# Patient Record
Sex: Male | Born: 1965 | Race: White | Hispanic: No | Marital: Single | Smoking: Current every day smoker
Health system: Southern US, Community
[De-identification: ages and names within clinical notes are randomized; demographics above are authoritative.]

---

## 2017-11-23 ENCOUNTER — Other Ambulatory Visit: Payer: Self-pay | Admitting: Pain Medicine

## 2017-11-23 ENCOUNTER — Encounter: Payer: Self-pay | Admitting: Internal Medicine

## 2017-11-23 ENCOUNTER — Ambulatory Visit (INDEPENDENT_AMBULATORY_CARE_PROVIDER_SITE_OTHER): Payer: 59 | Admitting: Internal Medicine

## 2017-11-23 ENCOUNTER — Ambulatory Visit
Admission: RE | Admit: 2017-11-23 | Discharge: 2017-11-23 | Disposition: A | Discharge status: Home / Self Care | Payer: 59 | Source: Ambulatory Visit | Attending: Pain Medicine | Admitting: Pain Medicine

## 2017-11-23 VITALS — BP 116/70 | HR 81 | Ht 73.0 in | Wt 208.0 lb

## 2017-11-23 DIAGNOSIS — G4733 Obstructive sleep apnea (adult) (pediatric): Secondary | ICD-10-CM

## 2017-11-23 DIAGNOSIS — Z720 Tobacco use: Secondary | ICD-10-CM | POA: Diagnosis not present

## 2017-11-23 DIAGNOSIS — J302 Other seasonal allergic rhinitis: Secondary | ICD-10-CM | POA: Diagnosis not present

## 2017-11-23 DIAGNOSIS — M542 Cervicalgia: Secondary | ICD-10-CM

## 2017-11-23 DIAGNOSIS — J3089 Other allergic rhinitis: Secondary | ICD-10-CM | POA: Diagnosis not present

## 2017-11-23 NOTE — Assessment & Plan Note (Signed)
High probability based on history and exam.  I think a home sleep test will be appropriate for him.  Note that he was diagnosed with OSA many years ago in [place] but did not get established well on CPAP 10.  Appropriate discussion done and questions answered.  His responsibility to drive safely.  Plan-we will sleep study.  He is to call me for results anticipating we will probably be able to start CPAP.

## 2017-11-23 NOTE — Patient Instructions (Signed)
Order- schedule unattended home sleep test      Dx OSA  Please call me about 2 weeks after your sleep study for results and recommendations. We may be able to start treatment, if appropriate, before we see you back.

## 2017-11-23 NOTE — Assessment & Plan Note (Signed)
Intervals of nasal congestion are likely to complicate CPAP use. Plan-anticipate starting with Flonase and antihistamines as needed.  We will deal with this if it becomes an issue.

## 2017-11-23 NOTE — Progress Notes (Signed)
11/23/17-52 year old male smoker for sleep evaluation referred courtesy of Kathee DeltonKris Kaplan, PA-C. Medical problem list from Care Everywhere-chronic low back pain, hyperlipidemia Patient has been ----diagnosed with OSA at least 15 years ago. Tried a CPAP machine but did not like it. Wants to see about getting another SS done due to snoring and feeling tired during the day.  Girlfriend complains of his loud snoring and has witnessed apneas.  He admits daytime sleepiness which sometimes gets in his way.  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  No night call responsibility.  Original sleep study was in [place] where he says he got little explanation and not enough help adjusting CPAP for comfort. No ENT surgery.  Not aware of heart or lung disease.  Denies parasomnias.  Some seasonal allergic rhinitis.           Prior to Admission medications   Medication Sig Start Date End Date Taking? Authorizing Provider  HYDROcodone-acetaminophen [facility](NORCO) 10-325 MG tablet  11/14/17  Yes [provider]  meloxicam (MOBIC) 15 MG tablet  09/27/17  Yes [provider]  pantoprazole (PROTONIX) 40 MG tablet  10/27/17  Yes [provider]   History reviewed. No pertinent past medical history. History reviewed. No pertinent surgical history. History reviewed. No pertinent family history. Social History   Socioeconomic History  . Marital status: Single    Spouse name: Not on file  . Number of children: Not on file  . Years of education: Not on file  . Highest education level: Not on file  Social Needs  . Financial resource strain: Not on file  . Food insecurity - worry: Not on file  . Food insecurity - inability: Not on file  . Transportation needs - medical: Not on file  . Transportation needs - non-medical: Not on file  Occupational History  . Not on file  Tobacco Use  . Smoking status: Current Every Day Smoker  . Smokeless tobacco: Never Used  Substance and Sexual Activity   . Alcohol use: Not on file  . Drug use: Not on file  . Sexual activity: Not on file  Other Topics Concern  . Not on file  Social History Narrative  . Not on file   ROS-see HPI    + sign = positive Constitutional:    weight loss, night sweats, fevers, chills, +fatigue, lassitude. HEENT:    headaches, difficulty swallowing, tooth/dental problems, sore throat,       sneezing, itching, ear ache, +nasal congestion, post nasal drip, snoring CV:    chest pain, orthopnea, PND, swelling in lower extremities, anasarca,                                  dizziness, palpitations Resp:   shortness of breath with exertion or at rest.                productive cough,  + non-productive cough, coughing up of blood.              change in color of mucus.  wheezing.   Skin:    rash or lesions. GI:  No-   heartburn, indigestion, abdominal pain, nausea, vomiting, diarrhea,                 change in bowel habits, loss of appetite GU: dysuria, change in color of urine, no urgency or frequency.   flank pain. MS:   joint pain, stiffness,  decreased range of motion, back pain. Neuro-     nothing unusual Psych:  change in mood or affect.  depression or +anxiety.   memory loss.  OBJ- Physical Exam General- Alert, Oriented, Affect-appropriate, Distress- none acute Skin- rash-none, lesions- none, excoriation- none Lymphadenopathy- none Head- atraumatic            Eyes- Gross vision intact, PERRLA, conjunctivae and secretions clear            Ears- Hearing, canals-normal            Nose- Clear, no-Septal dev, mucus, polyps, erosion, perforation             Throat- Mallampati III , mucosa clear , drainage- none, tonsils + present, own teeth Neck- flexible , trachea midline, no stridor , thyroid nl, carotid no bruit Chest - symmetrical excursion , unlabored           Heart/CV- RRR , no murmur , no gallop  , no rub, nl s1 s2                           - JVD- none , edema- none, stasis changes- none, varices- none            Lung- clear to P&A, wheeze- none, cough+light, dullness-none, rub- none           Chest wall-  Abd-  Br/ Gen/ Rectal- Not done, not indicated Extrem- cyanosis- none, clubbing, none, atrophy- none, strength- nl Neuro- grossly intact to observation

## 2017-11-23 NOTE — Assessment & Plan Note (Signed)
He started with a pipe and transitioned to cigarettes.  Emphasis on making a real commitment to smoking cessation.

## 2017-12-29 ENCOUNTER — Other Ambulatory Visit: Payer: Self-pay | Admitting: Pain Medicine

## 2017-12-29 DIAGNOSIS — M545 Low back pain: Secondary | ICD-10-CM

## 2018-01-05 ENCOUNTER — Ambulatory Visit
Admission: RE | Admit: 2018-01-05 | Discharge: 2018-01-05 | Disposition: A | Discharge status: Home / Self Care | Payer: 59 | Source: Ambulatory Visit | Attending: Pain Medicine | Admitting: Pain Medicine

## 2018-01-05 DIAGNOSIS — M545 Low back pain: Secondary | ICD-10-CM

## 2018-01-10 DIAGNOSIS — G4733 Obstructive sleep apnea (adult) (pediatric): Secondary | ICD-10-CM | POA: Diagnosis not present

## 2018-01-12 ENCOUNTER — Other Ambulatory Visit: Payer: Self-pay | Admitting: *Deleted

## 2018-01-12 DIAGNOSIS — G4733 Obstructive sleep apnea (adult) (pediatric): Secondary | ICD-10-CM

## 2018-01-16 DIAGNOSIS — G4733 Obstructive sleep apnea (adult) (pediatric): Secondary | ICD-10-CM | POA: Diagnosis not present

## 2018-02-08 ENCOUNTER — Encounter: Payer: Self-pay | Admitting: *Deleted

## 2018-02-08 NOTE — Progress Notes (Signed)
Since 4th attempt I am doing to send a letter to patient to call office, since there is no voicemail to set up. Will leave open to follow up on

## 2018-02-23 ENCOUNTER — Encounter: Payer: Self-pay | Admitting: Internal Medicine

## 2018-02-23 ENCOUNTER — Ambulatory Visit (INDEPENDENT_AMBULATORY_CARE_PROVIDER_SITE_OTHER): Payer: 59 | Admitting: Internal Medicine

## 2018-02-23 VITALS — BP 114/76 | HR 78 | Ht 73.0 in | Wt 199.0 lb

## 2018-02-23 DIAGNOSIS — G4733 Obstructive sleep apnea (adult) (pediatric): Secondary | ICD-10-CM | POA: Diagnosis not present

## 2018-02-23 DIAGNOSIS — Z720 Tobacco use: Secondary | ICD-10-CM

## 2018-02-23 DIAGNOSIS — J3089 Other allergic rhinitis: Secondary | ICD-10-CM

## 2018-02-23 DIAGNOSIS — J302 Other seasonal allergic rhinitis: Secondary | ICD-10-CM

## 2018-02-23 NOTE — Progress Notes (Signed)
[birth year]23/17-52 year old male smoker for sleep evaluation referred courtesy of Kathee Delton, PA-C. Medical problem list from Care Everywhere-chronic low back pain, hyperlipidemia Patient has been ----diagnosed with OSA at least 15 years ago. Tried a CPAP machine but did not like it. Wants to see about getting another SS done due to snoring and feeling tired during the day.  Girlfriend complains of his loud snoring and has witnessed apneas.  He admits daytime sleepiness which sometimes gets in his way.  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  Original sleep study was in [place] where he says he got little explanation and not enough help adjusting CPAP for comfort. No ENT surgery.  Not aware of heart or lung disease.  Denies parasomnias.  Some seasonal allergic rhinitis.           02/23/18-52 year old male smoker followed for OSA, complicated by tobacco use, chronic low back pain, hyperlipidemia, allergic rhinitis  HST 01/10/18- AHI 9.1/ hr, desaturation to 89%, body weight 208 lbs ----OSA: Pt here to review HST.  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  We discussed driving responsibility, good sleep habits, treatment options for mild OSA.  He wants to try CPAP again.  We will start with auto 5-10.  ROS-see HPI    + sign = positive Constitutional:    weight loss, night sweats, fevers, chills, +fatigue, lassitude. HEENT:    headaches, difficulty swallowing, tooth/dental problems, sore throat,       sneezing, itching, ear ache, +nasal congestion, post nasal drip, snoring CV:    chest pain, orthopnea, PND, swelling in lower extremities, anasarca,                                  dizziness, palpitations Resp:   shortness of breath with exertion or at rest.                productive cough,  + non-productive cough, coughing up of blood.              change in color of mucus.  wheezing.   Skin:    rash or lesions. GI:  No-   heartburn, indigestion,  abdominal pain, nausea, vomiting, diarrhea,                 change in bowel habits, loss of appetite GU: dysuria, change in color of urine, no urgency or frequency.   flank pain. MS:   joint pain, stiffness, decreased range of motion, back pain. Neuro-     nothing unusual Psych:  change in mood or affect.  depression or +anxiety.   memory loss.  OBJ- Physical Exam General- Alert, Oriented, Affect-appropriate, Distress- none acute Skin- rash-none, lesions- none, excoriation- none Lymphadenopathy- none Head- atraumatic            Eyes- Gross vision intact, PERRLA, conjunctivae and secretions clear            Ears- Hearing, canals-normal            Nose- Clear, no-Septal dev, mucus, polyps, erosion, perforation             Throat- Mallampati III , mucosa clear , drainage- none, tonsils + present, own teeth Neck- flexible , trachea midline, no stridor , thyroid nl, carotid no bruit Chest - symmetrical excursion , unlabored           Heart/CV- RRR ,  no murmur , no gallop  , no rub, nl s1 s2                           - JVD- none , edema- none, stasis changes- none, varices- none           Lung- clear to P&A, wheeze- none, cough+light, dullness-none, rub- none           Chest wall-  Abd-  Br/ Gen/ Rectal- Not done, not indicated Extrem- cyanosis- none, clubbing, none, atrophy- none, strength- nl Neuro- grossly intact to observation

## 2018-02-23 NOTE — Patient Instructions (Signed)
Order- new DME, new CPAP auto 5-20, mask of choice, humidifier, supplies, AirView     Dx OSA  Please call as needed   

## 2018-02-24 NOTE — Assessment & Plan Note (Signed)
Appropriate educational discussion and consideration of treatment options. Plan-CPAP auto 5-10

## 2018-02-24 NOTE — Assessment & Plan Note (Signed)
He is not having enough trouble yet to interfere with CPAP use or comfortable sleep.  We discussed Flonase/Claritin if needed.

## 2018-02-24 NOTE — Assessment & Plan Note (Signed)
Is advised to stop smoking.

## 2018-05-31 ENCOUNTER — Ambulatory Visit: Payer: 59 | Admitting: Internal Medicine

## 2019-12-05 IMAGING — CR DG CERVICAL SPINE 2 OR 3 VIEWS
5 series · 5 of 5 positions shown · non-contrast
Comparison: None.

CLINICAL DATA: Chronic right neck pain radiating to right shoulder
for several months. No known injury. Initial encounter.

EXAM:
CERVICAL SPINE - 2-3 VIEW

[w cervical spine lat]
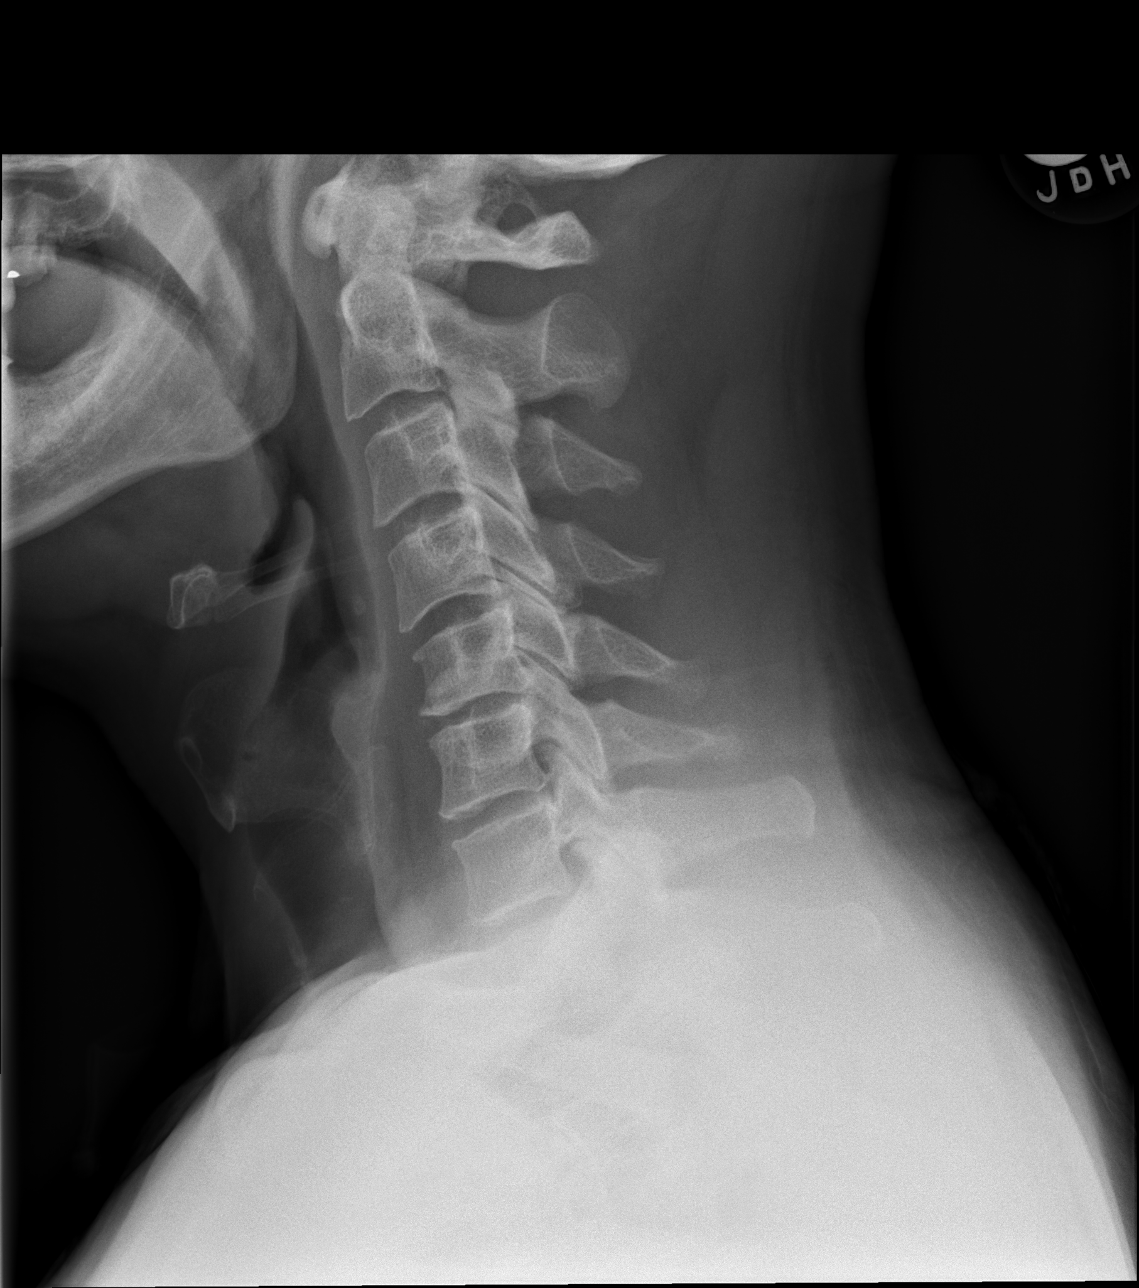

[w cervical swimmers]
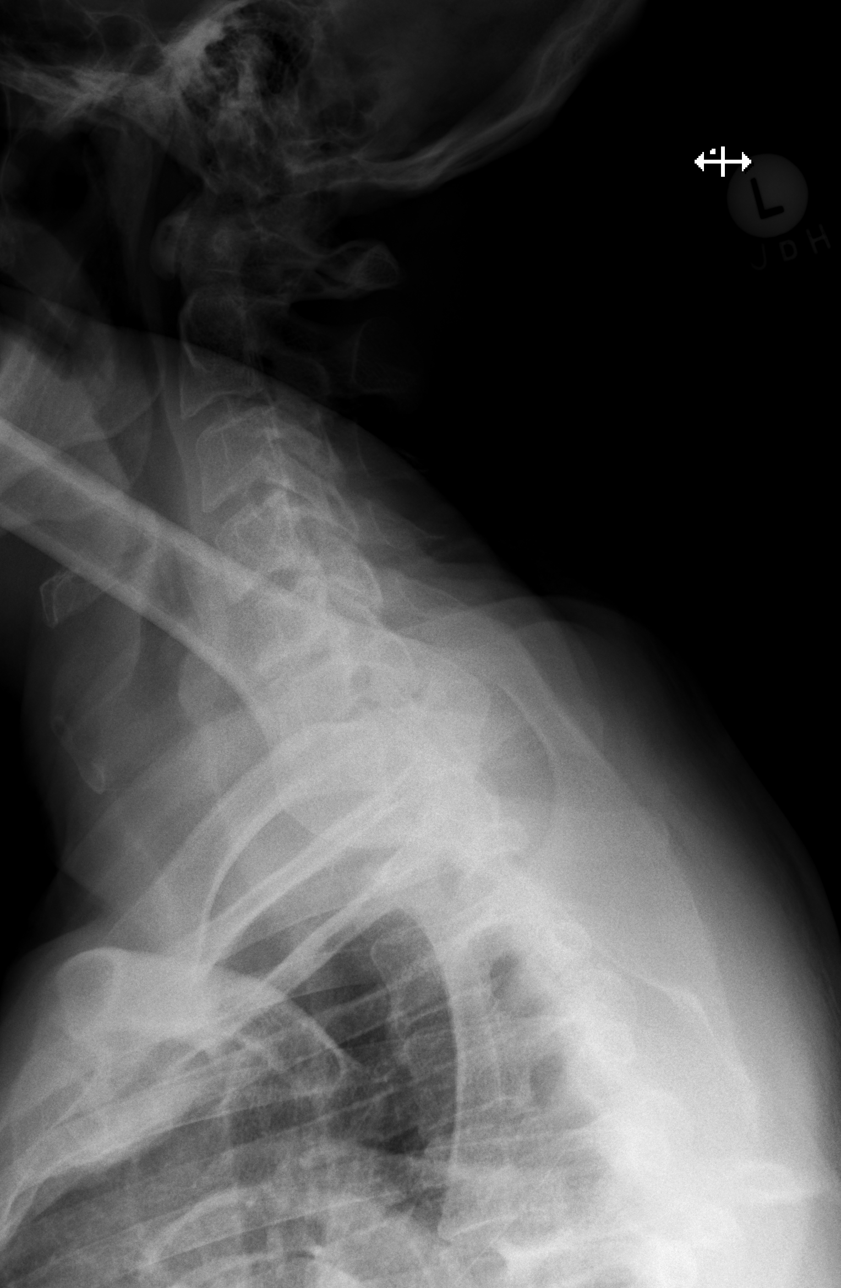

[w cervical spine ap]
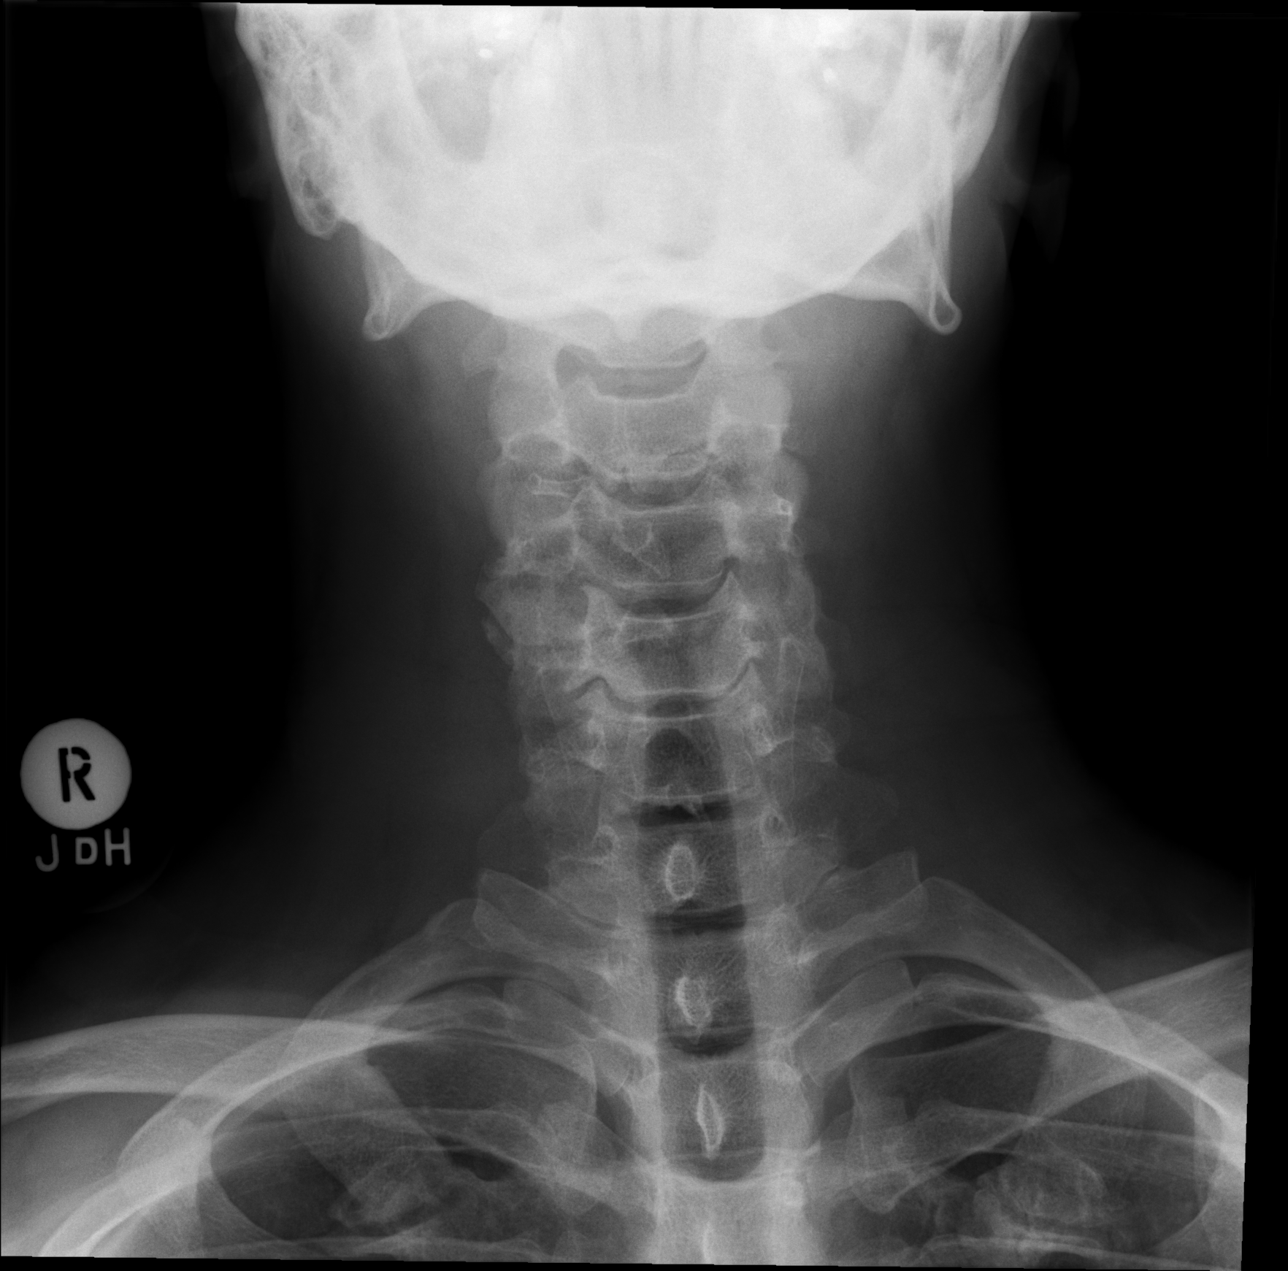

[w cervical spine odontoid (1 of 2)]
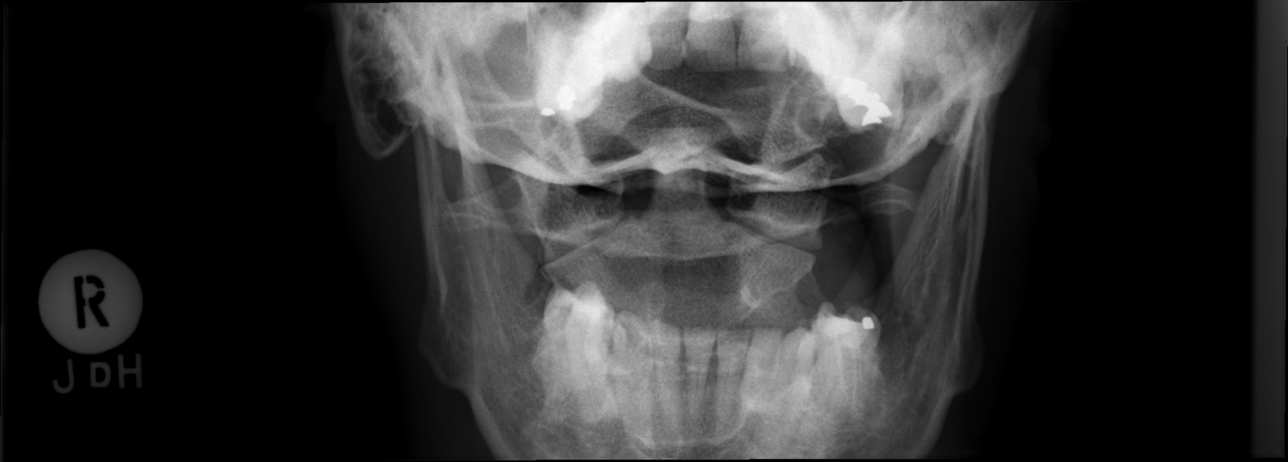

[w cervical spine odontoid (2 of 2)]
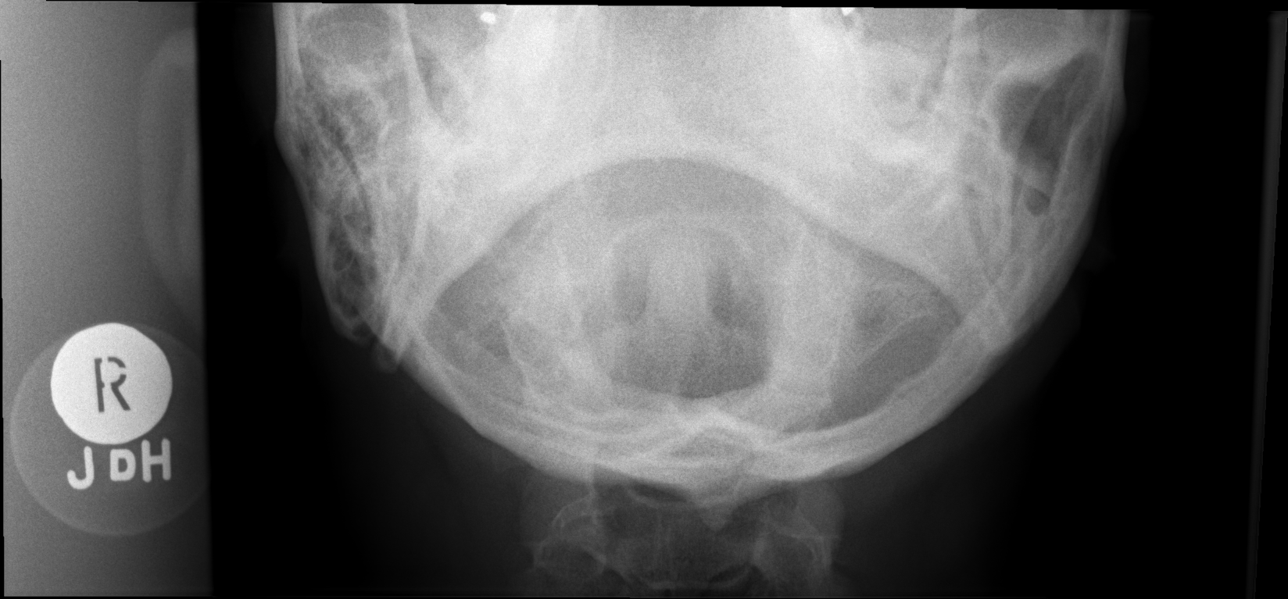

[5 of 5 positions shown; findings below may reference images not displayed]

FINDINGS: Normal alignment noted.

There is no evidence of acute fracture, subluxation or prevertebral
soft tissue swelling.

Mild degenerative disc disease and spondylosis at C5-C6 and C6-C7
noted.

Mild multilevel facet arthropathy identified.

No focal bony lesions are present.
IMPRESSION: Mild degenerative changes within the cervical spine as described.

## 2021-04-30 ENCOUNTER — Encounter (HOSPITAL_BASED_OUTPATIENT_CLINIC_OR_DEPARTMENT_OTHER): Payer: Self-pay | Admitting: *Deleted

## 2021-04-30 ENCOUNTER — Other Ambulatory Visit: Payer: Self-pay

## 2021-04-30 ENCOUNTER — Emergency Department (HOSPITAL_BASED_OUTPATIENT_CLINIC_OR_DEPARTMENT_OTHER)
Admission: EM | Admit: 2021-04-30 | Discharge: 2021-04-30 | Disposition: A | Discharge status: Home / Self Care | Payer: Self-pay | Attending: Emergency Medicine | Admitting: Emergency Medicine

## 2021-04-30 ENCOUNTER — Emergency Department (HOSPITAL_BASED_OUTPATIENT_CLINIC_OR_DEPARTMENT_OTHER): Payer: Self-pay

## 2021-04-30 DIAGNOSIS — S61213A Laceration without foreign body of left middle finger without damage to nail, initial encounter: Secondary | ICD-10-CM | POA: Insufficient documentation

## 2021-04-30 DIAGNOSIS — F17210 Nicotine dependence, cigarettes, uncomplicated: Secondary | ICD-10-CM | POA: Insufficient documentation

## 2021-04-30 DIAGNOSIS — S61012A Laceration without foreign body of left thumb without damage to nail, initial encounter: Secondary | ICD-10-CM | POA: Insufficient documentation

## 2021-04-30 DIAGNOSIS — W293XXA Contact with powered garden and outdoor hand tools and machinery, initial encounter: Secondary | ICD-10-CM | POA: Insufficient documentation

## 2021-04-30 DIAGNOSIS — S61215A Laceration without foreign body of left ring finger without damage to nail, initial encounter: Secondary | ICD-10-CM | POA: Insufficient documentation

## 2021-04-30 MED ORDER — HYDROCODONE-ACETAMINOPHEN 5-325 MG PO TABS: 1.0000 | ORAL_TABLET | ORAL | 0 refills | Status: AC | PRN

## 2021-04-30 MED ORDER — HYDROMORPHONE HCL 1 MG/ML IJ SOLN
INTRAMUSCULAR | Status: AC
  Filled 2021-04-30: qty 1

## 2021-04-30 MED ORDER — BUPIVACAINE HCL 0.5 % IJ SOLN
50.0000 mL | Freq: Once | INTRAMUSCULAR | Status: AC
  Administered 2021-04-30: 50 mL
  Filled 2021-04-30: qty 1

## 2021-04-30 MED ORDER — SULFAMETHOXAZOLE-TRIMETHOPRIM 800-160 MG PO TABS: 1.0000 | ORAL_TABLET | Freq: Two times a day (BID) | ORAL | 0 refills | Status: DC

## 2021-04-30 MED ORDER — ONDANSETRON HCL 4 MG/2ML IJ SOLN
INTRAMUSCULAR | Status: AC
  Filled 2021-04-30: qty 2

## 2021-04-30 MED ORDER — ONDANSETRON HCL 4 MG/2ML IJ SOLN
4.0000 mg | Freq: Once | INTRAMUSCULAR | Status: AC
  Administered 2021-04-30: 4 mg via INTRAVENOUS

## 2021-04-30 MED ORDER — HYDROMORPHONE HCL 1 MG/ML IJ SOLN
1.0000 mg | Freq: Once | INTRAMUSCULAR | Status: AC
  Administered 2021-04-30: 1 mg via INTRAVENOUS

## 2021-04-30 MED ORDER — CEFAZOLIN SODIUM-DEXTROSE 1-4 GM/50ML-% IV SOLN
1.0000 g | Freq: Once | INTRAVENOUS | Status: AC
  Administered 2021-04-30: 1 g via INTRAVENOUS
  Filled 2021-04-30: qty 50

## 2021-04-30 NOTE — ED Triage Notes (Signed)
C/o lac to left hand 3 fingers by lawn trimmers x 3 hrs ago

## 2021-04-30 NOTE — Discharge Instructions (Addendum)
Wound recheck in 2 days.  Suture removal in 8 days.  Watch carefully for any sign of infection

## 2021-04-30 NOTE — ED Notes (Signed)
Rinsed with sterile water, due to burning sensations on fingers and removed ring from affected hand.

## 2021-05-01 NOTE — ED Provider Notes (Signed)
[facility] EMERGENCY DEPARTMENT Provider Note   CSN: [id number] Arrival date & time: 04/30/21  1452     History Chief Complaint  Patient presents with   Laceration    hand    Jordan Knight is a 55 y.o. male.  The history is provided by the patient. No language interpreter was used.  Laceration Location:  Hand Hand laceration location:  L hand Laceration mechanism:  Metal edge Pain details:    Quality:  Aching   Timing:  Constant   Progression:  Worsening Relieved by:  Nothing Worsened by:  Nothing Ineffective treatments:  None tried Tetanus status:  Up to date Pt cut his fingers on a hedgetrimmer.  Pt complains of lacerations on his left thumb, 3rd finger and 4th finger     History reviewed. No pertinent past medical history.  Patient Active Problem List   Diagnosis Date Noted   Obstructive sleep apnea 11/23/2017   Tobacco abuse 11/23/2017   Seasonal and perennial allergic rhinitis 11/23/2017    History reviewed. No pertinent surgical history.     No family history on file.  Social History   Tobacco Use   Smoking status: Every Day    Pack years: 0.00    Types: Cigarettes   Smokeless tobacco: Never    Home Medications Prior to Admission medications   Medication Sig Start Date End Date Taking? Authorizing Provider  HYDROcodone-acetaminophen (NORCO/VICODIN) 5-325 MG tablet Take 1 tablet by mouth every 4 (four) hours as needed for moderate pain. 04/30/21 04/30/22 Yes Cheron Schaumann K, PA-C  sulfamethoxazole-trimethoprim (BACTRIM DS) 800-160 MG tablet Take 1 tablet by mouth 2 (two) times daily. 04/30/21  Yes Elson Areas, PA-C  meloxicam (MOBIC) 15 MG tablet  09/27/17   [provider]  pantoprazole (PROTONIX) 40 MG tablet  10/27/17   [provider]    Allergies    Penicillin g  Review of Systems   Review of Systems  All other systems reviewed and are negative.  Physical Exam Updated Vital Signs BP (!) 181/117 (BP  Location: Right Arm)   Pulse 81   Temp 98.1 F (36.7 C) (Oral)   Resp 18   Ht 6\' 1"  (1.854 m)   Wt 86.2 kg   SpO2 100%   BMI 25.07 kg/m   Physical Exam Vitals reviewed.  Cardiovascular:     Rate and Rhythm: Normal rate.  Pulmonary:     Effort: Pulmonary effort is normal.  Abdominal:     General: Abdomen is flat.  Musculoskeletal:     Comments: 2.5 cm laceration left thub,  1.5 cm laceration distal 3rd finger and 1cm laceration 4th finger from  nv and ns intact  Skin:    General: Skin is warm.  Neurological:     General: No focal deficit present.     Mental Status: He is alert.  Psychiatric:        Mood and Affect: Mood normal.    ED Results / Procedures / Treatments   Labs (all labs ordered are listed, but only abnormal results are displayed) Labs Reviewed - No data to display  EKG None  Radiology DG Hand Complete Left  Result Date: 04/30/2021 CLINICAL DATA:  Left hand laceration. EXAM: LEFT HAND - COMPLETE 3+ VIEW COMPARISON:  None. FINDINGS: There is no evidence of fracture or dislocation. There is no evidence of arthropathy or other focal bone abnormality. Multiple small densities are seen in or on the soft tissues of the second, third and  fifth fingers; foreign bodies cannot be excluded. IMPRESSION: No fracture or dislocation is noted multiple small densities are seen in or on the soft tissues of the second, third and fifth fingers which may represent foreign bodies or debris. Electronically Signed   By: Lupita RaiderJames  Green Jr M.D.   On: 04/30/2021 16:17    Procedures .Marland Kitchen.Laceration Repair  Date/Time: 05/01/2021 12:53 AM Performed by: Elson AreasSofia, Azari Janssens K, PA-C Authorized by: Elson AreasSofia, Jerusalem Wert K, PA-C   Consent:    Consent obtained:  Verbal   Consent given by:  Patient   Alternatives discussed:  No treatment Universal protocol:    Procedure explained and questions answered to patient or proxy's satisfaction: no     Patient identity confirmed:  Verbally with patient Anesthesia:     Anesthesia method:  Nerve block   Block needle gauge:  27 G Laceration details:    Location: thumb left.   Length (cm):  2.5   Depth (mm):  2 Pre-procedure details:    Preparation:  Patient was prepped and draped in usual sterile fashion Exploration:    Limited defect created (wound extended): no     Wound exploration: wound explored through full range of motion     Contaminated: yes   Treatment:    Area cleansed with:  Povidone-iodine   Irrigation solution:  Sterile saline   Visualized foreign bodies/material removed: no     Debridement:  None   Undermining:  None   Scar revision: no   Skin repair:    Repair method:  Sutures   Suture size:  5-0   Suture material:  Prolene   Suture technique:  Simple interrupted   Number of sutures:  6 Approximation:    Approximation:  Loose Repair type:    Repair type:  Simple Post-procedure details:    Dressing:  Non-adherent dressing   Procedure completion:  Tolerated .Marland Kitchen.Laceration Repair  Date/Time: 05/01/2021 12:56 AM Performed by: Elson AreasSofia, Dalaysia Harms K, PA-C Authorized by: Elson AreasSofia, Trenten Watchman K, PA-C   Consent:    Consent obtained:  Verbal   Consent given by:  Patient   Risks discussed:  Infection Universal protocol:    Procedure explained and questions answered to patient or proxy's satisfaction: yes     Relevant documents present and verified: yes     Test results available: yes     Imaging studies available: yes     Required blood products, implants, devices, and special equipment available: yes     Site/side marked: yes     Immediately prior to procedure, a time out was called: yes     Patient identity confirmed:  Verbally with patient Anesthesia:    Anesthesia method:  Nerve block   Block location:  Left 3rd finger   Block needle gauge:  27 G   Block anesthetic:  Bupivacaine 0.5% w/o epi Laceration details:    Location: left 3rd finger.   Length (cm):  2   Depth (mm):  4 Pre-procedure details:    Preparation:  Patient was  prepped and draped in usual sterile fashion Exploration:    Limited defect created (wound extended): no   Treatment:    Area cleansed with:  Povidone-iodine   Visualized foreign bodies/material removed: no     Debridement:  None Skin repair:    Repair method:  Sutures   Suture size:  5-0   Suture material:  Prolene   Suture technique:  Simple interrupted   Number of sutures:  4 Approximation:    Approximation:  Loose  Repair type:    Repair type:  Simple Post-procedure details:    Dressing:  Non-adherent dressing   Procedure completion:  Tolerated .Marland KitchenLaceration Repair  Date/Time: 05/01/2021 12:59 AM Performed by: Elson Areas, PA-C Authorized by: Elson Areas, PA-C   Consent:    Consent obtained:  Verbal   Consent given by:  Patient   Risks, benefits, and alternatives were discussed: yes     Risks discussed:  Infection   Alternatives discussed:  No treatment Universal protocol:    Procedure explained and questions answered to patient or proxy's satisfaction: yes     Relevant documents present and verified: yes     Test results available: yes     Patient identity confirmed:  Verbally with patient Anesthesia:    Anesthesia method:  Local infiltration   Local anesthetic:  Bupivacaine 0.5% w/o epi Laceration details:    Location: left 4th finger.   Length (cm):  1   Depth (mm):  2 Pre-procedure details:    Preparation:  Patient was prepped and draped in usual sterile fashion Exploration:    Limited defect created (wound extended): no     Wound exploration: wound explored through full range of motion     Contaminated: no   Treatment:    Area cleansed with:  Povidone-iodine   Amount of cleaning:  Standard   Debridement:  None   Undermining:  None Skin repair:    Repair method:  Sutures   Suture size:  5-0   Suture material:  Prolene   Suture technique:  Simple interrupted   Number of sutures:  4 Approximation:    Approximation:  Loose Repair type:    Repair  type:  Simple Post-procedure details:    Dressing:  Non-adherent dressing   Procedure completion:  Tolerated   Medications Ordered in ED Medications  ondansetron (ZOFRAN) injection 4 mg (4 mg Intravenous Given 04/30/21 1520)  HYDROmorphone (DILAUDID) injection 1 mg (1 mg Intravenous Given 04/30/21 1521)  ceFAZolin (ANCEF) IVPB 1 g/50 mL premix (0 g Intravenous Stopped 04/30/21 1614)  bupivacaine (MARCAINE) 0.5 % (with pres) injection 50 mL (50 mLs Infiltration Given by Other 04/30/21 1744)    ED Course  I have reviewed the triage vital signs and the nursing notes.  Pertinent labs & imaging results that were available during my care of the patient were reviewed by me and considered in my medical decision making (see chart for details).    MDM Rules/Calculators/A&P                          MDM:  Pt counseled on wound care and need to watch for infection. An After Visit Summary was printed and given to the patient.  Final Clinical Impression(s) / ED Diagnoses Final diagnoses:  Laceration of left thumb without foreign body without damage to nail, initial encounter  Laceration of left middle finger without damage to nail, foreign body presence unspecified, initial encounter  Laceration of left ring finger, foreign body presence unspecified, nail damage status unspecified, initial encounter    Rx / DC Orders ED Discharge Orders          Ordered    sulfamethoxazole-trimethoprim (BACTRIM DS) 800-160 MG tablet  2 times daily        04/30/21 1850    HYDROcodone-acetaminophen (NORCO/VICODIN) 5-325 MG tablet  Every 4 hours PRN        04/30/21 1850  An After Visit Summary was printed and given to the patient.    Elson Areas, PA-C 05/01/21 0100    Melene Plan, DO 05/01/21 1353

## 2024-02-09 ENCOUNTER — Other Ambulatory Visit (HOSPITAL_BASED_OUTPATIENT_CLINIC_OR_DEPARTMENT_OTHER): Payer: Self-pay | Admitting: Physician Assistant

## 2024-02-09 DIAGNOSIS — M5412 Radiculopathy, cervical region: Secondary | ICD-10-CM

## 2024-02-13 ENCOUNTER — Ambulatory Visit

## 2024-02-13 DIAGNOSIS — M542 Cervicalgia: Secondary | ICD-10-CM

## 2024-02-13 DIAGNOSIS — M5412 Radiculopathy, cervical region: Secondary | ICD-10-CM

## 2024-04-16 ENCOUNTER — Other Ambulatory Visit: Payer: Self-pay | Admitting: Neurosurgery

## 2024-04-16 DIAGNOSIS — M4722 Other spondylosis with radiculopathy, cervical region: Secondary | ICD-10-CM

## 2024-04-16 DIAGNOSIS — T8189XA Other complications of procedures, not elsewhere classified, initial encounter: Secondary | ICD-10-CM

## 2024-05-09 ENCOUNTER — Ambulatory Visit: Admitting: Plastic Surgery

## 2024-05-09 ENCOUNTER — Encounter: Payer: Self-pay | Admitting: Plastic Surgery

## 2024-05-09 VITALS — BP 110/80 | HR 76 | Ht 73.0 in | Wt 201.0 lb

## 2024-05-09 DIAGNOSIS — S0190XA Unspecified open wound of unspecified part of head, initial encounter: Secondary | ICD-10-CM | POA: Diagnosis not present

## 2024-05-09 DIAGNOSIS — S1190XA Unspecified open wound of unspecified part of neck, initial encounter: Secondary | ICD-10-CM | POA: Diagnosis not present

## 2024-05-09 NOTE — Progress Notes (Signed)
 Referring Provider Debrah Josette ORN., PA-C [address]  [place] [address]   CC:  Chief Complaint  Patient presents with   Consult      Jordan Knight is an 58 y.o. male.  HPI: Jordan Knight is a 58 year old male with a complicated history of a posterior cervical fusion complicated by wound dehiscence in July 2024.  This was later treated with wound debridement and resection of spinous process and primary closure.  He is still had wound healing difficulties.  He was referred for management of the wound.  Allergies  Allergen Reactions   Blue Dyes (Parenteral) Other (See Comments)    Wife and patient verbalized patient doesn't sleep for days when blue dye is ingested.    Penicillin G Nausea Only    Outpatient Encounter Medications as of 05/09/2024  Medication Sig   cyclobenzaprine (FLEXERIL) 10 MG tablet Take one tablet (10 mg dose) by mouth 2 (two) times a day as needed for Muscle spasms.   lisinopril (ZESTRIL) 20 MG tablet TAKE 1 TABLET BY MOUTH EVERY DAY for 90   LYCOPENE PO Take by mouth.   oxyCODONE (ROXICODONE) 15 MG immediate release tablet Take 15 mg by mouth every 6 (six) hours as needed.   pantoprazole (PROTONIX) 40 MG tablet    rosuvastatin (CRESTOR) 10 MG tablet Take 10 mg by mouth.   sertraline (ZOLOFT) 25 MG tablet Take 25 mg by mouth daily.   sildenafil (VIAGRA) 25 MG tablet Take one tablet (25 mg dose) by mouth daily as needed for Erectile Dysfunction.   testosterone cypionate (DEPOTESTOTERONE CYPIONATE) 100 MG/ML injection Inject 100 mg into the muscle once a week.   Vitamin D, Ergocalciferol, (DRISDOL) 1.25 MG (50000 UNIT) CAPS capsule Take one capsule (50,000 Units dose) by mouth once a week. Monday   [DISCONTINUED] meloxicam (MOBIC) 15 MG tablet  (Patient not taking: Reported on 05/09/2024)   [DISCONTINUED] sulfamethoxazole -trimethoprim  (BACTRIM  DS) 800-160 MG tablet Take 1 tablet by mouth 2 (two) times daily. (Patient not taking: Reported on  05/09/2024)   No facility-administered encounter medications on file as of 05/09/2024.     No past medical history on file.  No past surgical history on file.  No family history on file.  Social History   Social History Narrative   Not on file     Review of Systems General: Denies fevers, chills, weight loss CV: Denies chest pain, shortness of breath, palpitations Skin: Wound healing difficulties after anterior cervical fusion  Physical Exam    04/30/2021    7:02 PM 04/30/2021    4:00 PM 04/30/2021    3:11 PM  Vitals with BMI  Systolic 181 152 848  Diastolic 117 112 888  Pulse 81   81 94 117    General:  No acute distress,  Alert and oriented, Non-Toxic, Normal speech and affect Skin: Patient has an incision approximately 10 cm in length over the posterior neck and upper back.  There are 2 small areas of the wound each approximately 1 x 1 cm the upper portion is approximately 5 to 7 mm deep.  All hardware and bone are covered.  There is no obvious drainage or infection.   Assessment/Plan Wound posterior neck: Patient has a slowly healing wound on posterior neck.  He is referred for skin grafting.  There really is no place for skin grafting at this time.  He is scheduled for an MRI and CT scan of the neck.  Once these are done and  we can rule out underlying infection I think it may be reasonable to place a small portion of myriad in the upper wound.  This may speed healing.  I would not proceed to any type of flap at this time.  He does smoke.  We had a long discussion about how nicotine causes profound vasoconstriction in the microvasculature leading to poor wound healing.  He is going to work on smoking cessation.  I have encouraged him to speak with his primary care doctor regarding this.  I did place a silicone border dressing over the wounds today.  I believe this will help keep the skin protected so that his shirt does not rub across the top of the wounds excoriating them.  I  showed his wife what type of dressings these were and he will purchase some from [place].  I believe it is reasonable for him to begin showering again.  Follow-up with me after the CT and MRI are completed.  I spent 45 minutes reviewing the patient's chart examining the patient discussing treatment options coordinating care and documenting.  Jordan Knight 05/09/2024, 3:34 PM

## 2024-05-14 ENCOUNTER — Ambulatory Visit
Admission: RE | Admit: 2024-05-14 | Discharge: 2024-05-14 | Disposition: A | Discharge status: Home / Self Care | Source: Ambulatory Visit | Attending: Neurosurgery | Admitting: Neurosurgery

## 2024-05-14 DIAGNOSIS — T8189XA Other complications of procedures, not elsewhere classified, initial encounter: Secondary | ICD-10-CM

## 2024-05-14 DIAGNOSIS — M4722 Other spondylosis with radiculopathy, cervical region: Secondary | ICD-10-CM
